# Patient Record
Sex: Male | Born: 1990 | Race: White | Hispanic: No | Smoking: Never smoker
Health system: Southern US, Community
[De-identification: ages and names within clinical notes are randomized; demographics above are authoritative.]

## PROBLEM LIST (undated history)

## (undated) DIAGNOSIS — R7989 Other specified abnormal findings of blood chemistry: Secondary | ICD-10-CM

## (undated) DIAGNOSIS — F988 Other specified behavioral and emotional disorders with onset usually occurring in childhood and adolescence: Secondary | ICD-10-CM

## (undated) DIAGNOSIS — N529 Male erectile dysfunction, unspecified: Secondary | ICD-10-CM

## (undated) HISTORY — DX: Male erectile dysfunction, unspecified: N52.9

## (undated) HISTORY — DX: Other specified abnormal findings of blood chemistry: R79.89

## (undated) HISTORY — DX: Other specified behavioral and emotional disorders with onset usually occurring in childhood and adolescence: F98.8

---

## 2016-02-06 DIAGNOSIS — F988 Other specified behavioral and emotional disorders with onset usually occurring in childhood and adolescence: Secondary | ICD-10-CM | POA: Insufficient documentation

## 2019-02-28 ENCOUNTER — Other Ambulatory Visit: Payer: Self-pay | Admitting: Internal Medicine

## 2019-03-21 ENCOUNTER — Ambulatory Visit: Payer: Self-pay | Admitting: Internal Medicine

## 2019-03-28 ENCOUNTER — Ambulatory Visit: Payer: Self-pay | Admitting: Internal Medicine

## 2019-03-28 ENCOUNTER — Other Ambulatory Visit: Payer: Self-pay

## 2019-03-28 ENCOUNTER — Encounter: Payer: Self-pay | Admitting: Internal Medicine

## 2019-03-28 VITALS — BP 148/92 | HR 89 | Temp 98.3°F | Resp 16 | Ht 70.0 in | Wt 252.0 lb

## 2019-03-28 DIAGNOSIS — F909 Attention-deficit hyperactivity disorder, unspecified type: Secondary | ICD-10-CM

## 2019-03-28 DIAGNOSIS — I10 Essential (primary) hypertension: Secondary | ICD-10-CM

## 2019-03-28 DIAGNOSIS — Z6836 Body mass index (BMI) 36.0-36.9, adult: Secondary | ICD-10-CM

## 2019-03-28 MED ORDER — LISINOPRIL 10 MG PO TABS: 10.0000 mg | ORAL_TABLET | Freq: Every day | ORAL | 3 refills | Status: DC

## 2019-03-28 MED ORDER — AMPHETAMINE-DEXTROAMPHETAMINE 30 MG PO TABS: 1.0000 | ORAL_TABLET | Freq: Every day | ORAL | 0 refills | Status: DC

## 2019-03-28 NOTE — Progress Notes (Signed)
S - Patient with no h/o HTN who follows-up for BP check-up. Was higher on his last visit (141/92) noted.  He does not exercise and has gained more weight (approx 5 lbs since last visit a month ago) His wife is pregnant and eating more and he thinks he follows.  Also requests refill of adderall, not taken in the last 4 days as wanted to see how his BP would be without and notes he can notice days when not takes, cannot focus as well. Stated tries to have days away, but I noted to him upon PMP review that he has been consistent with filling the meds on a monthly basis and not having many days away.   Has been feeling well with no complaints No CP, SOB, HA, palpitations, LE swelling.    No tob hx  Not really watch diet well  No Known Allergies    Current Outpatient Medications on File Prior to Visit  Medication Sig Dispense Refill  . amphetamine-dextroamphetamine (ADDERALL) 30 MG tablet TAKE 1 TABLET BY MOUTH DAILY AS DIRECTED 30 tablet 0   No current facility-administered medications on file prior to visit.    FH - no HTN noted    O - NAD, masked, obese   BP (!) 148/92 (BP Location: Left Arm, Patient Position: Sitting, Cuff Size: Large)   Pulse 89   Temp 98.3 F (36.8 C) (Oral)   Resp 16   Ht 5\' 10"  (1.778 m)   Wt 252 lb (114.3 kg)   SpO2 99%   BMI 36.16 kg/m   Recheck BP - 148/95 left with machine, large cuff Last weight 247.2 - 01/2019    HEENT - sclera anicteric Car - RRR without m/g/r Pulm - CTA Abd - obese, NT Ext - no LE edema Neuro - Affect not flat, approp with conversation   Last labs reviewed - kidney function normal, LDL 100 on check 01/2018    A/P - HTN - concern not as well controlled   Discussed addition of a BP medication to manage, added lisinopril - 10mg  daily Discussed goals for good control of BP  Importance of healthy diet and regular aerobic exercise and weight control noted If can get some home BP checks periodically and keep a log discussed,  would be helpful and he noted his mom has one he may be able to use.   2.  ADHD - tolerating stimulant medicine to help   Plan - Has signed [place] PMP reviewed Renewed medicine and aware cannot put refills on prescription Noted concerns with this med and higher BP, and the less use of the medicine would be helpful with respect to his BP and heart risks. Days away encouraged and periodic days without medicine can be beneficial  3.  Increased BMI/obesity  Importance of diet modifications and regular aerobic exercise emphasized with lifestyle changes adopted for the long term very important for success and often BP can correlate with weight increases noted.  F/u in 4 weeks, sooner prn

## 2019-03-28 NOTE — Patient Instructions (Signed)

## 2019-04-19 ENCOUNTER — Other Ambulatory Visit: Payer: Self-pay

## 2019-04-19 ENCOUNTER — Encounter: Payer: Self-pay | Admitting: Internal Medicine

## 2019-04-19 ENCOUNTER — Ambulatory Visit: Payer: 59 | Admitting: Internal Medicine

## 2019-04-19 VITALS — BP 127/90 | HR 88 | Temp 98.3°F | Resp 14 | Ht 70.0 in | Wt 248.0 lb

## 2019-04-19 DIAGNOSIS — I10 Essential (primary) hypertension: Secondary | ICD-10-CM

## 2019-04-19 DIAGNOSIS — E66812 Obesity, class 2: Secondary | ICD-10-CM | POA: Insufficient documentation

## 2019-04-19 DIAGNOSIS — F909 Attention-deficit hyperactivity disorder, unspecified type: Secondary | ICD-10-CM

## 2019-04-19 DIAGNOSIS — Z6835 Body mass index (BMI) 35.0-35.9, adult: Secondary | ICD-10-CM | POA: Insufficient documentation

## 2019-04-19 NOTE — Progress Notes (Signed)
S - Patient with no h/o HTN who follows-up for BPcheck-up after started on lisinopril last visit. Missed his dose yesterday am and was the first dose he missed. He has tried to exercise more, and change his diet, eating more salmon.   Trying to lessen the adderall as noted last visit as well, (noted he can notice days when not takes, cannot focus as well).    Has been feeling well with no complaints No CP, SOB, HA, palpitations, LE swelling.   He had a life insurance exam and his BP was very good then (done at work and was 120/60's). He took his BP once at home and was lower number in the 70's.  No tob hx  No Known Allergies Current Outpatient Medications on File Prior to Visit  Medication Sig Dispense Refill  . amphetamine-dextroamphetamine (ADDERALL) 30 MG tablet Take 1 tablet by mouth daily. as directed 30 tablet 0  . lisinopril (ZESTRIL) 10 MG tablet Take 1 tablet (10 mg total) by mouth daily. 90 tablet 3   No current facility-administered medications on file prior to visit.     FH - no HTN noted   O - NAD, masked, obese  BP 127/90 (BP Location: Right Arm, Patient Position: Sitting, Cuff Size: Large)   Pulse 88   Temp 98.3 F (36.8 C) (Oral)   Resp 14   Ht 5\' 10"  (1.778 m)   Wt 248 lb (112.5 kg)   SpO2 96%   BMI 35.58 kg/m    Recheck by machine by me was slightly higher BP -  140/99 (and he noted he was much more nervous on my recheck)  BP last visit -  148/92 weight last visit - 252 (down 4 lbs this visit)   HEENT - sclera anicteric Car - RRR without m/g/r Pulm - CTA Abd - obese, NT Ext - no LE edema Neuro - Affect not flat, approp with conversation  Last labs reviewed - kidney function normal, LDL 100 on check 01/2018   A/P - HTN - tolerating the low dose lisinopril. Outside readings have been very good, today still higher than goal noted in office  Cont lisinopril - 10mg  daily Discussed goals for good control of BP  Importance of healthy diet  and regular aerobic exercise and weight control noted and he has been doing better and encouraged to continue Rec'ed getting some home BP checks at least once weekly and keep a log discussed and bring them with him next visit   2.  ADHD - tolerating stimulant medicine to help   Trying to lessen this medicine as best can, lowest possible dose to get desired effects  3.  Increased BMI/obesity  Cont with lifestyle changes to help with weight loss as noted prior often BP can correlate with weight increases noted.  F/u in about 8 weeks with labs done for his annual physical then (fasting) and f/u a few days after the labs are obtained. F/u sooner prn

## 2019-04-23 ENCOUNTER — Ambulatory Visit: Payer: Self-pay | Admitting: Internal Medicine

## 2019-04-25 ENCOUNTER — Other Ambulatory Visit: Payer: Self-pay | Admitting: Internal Medicine

## 2019-04-25 MED ORDER — AMPHETAMINE-DEXTROAMPHETAMINE 30 MG PO TABS: 30.0000 mg | ORAL_TABLET | Freq: Every day | ORAL | 0 refills | Status: DC

## 2019-04-25 NOTE — Telephone Encounter (Signed)
Pt aware and needs to keep next appt for refills.

## 2019-04-25 NOTE — Telephone Encounter (Signed)
He was seen last week for his BP (in Epic).

## 2019-04-25 NOTE — Telephone Encounter (Signed)
Patient last seen May, Had that ADHD med refill and two more since. Needs f/u again before another refill (with his BP high on May visit noted).

## 2019-04-25 NOTE — Telephone Encounter (Signed)
Refilled med

## 2019-04-25 NOTE — Progress Notes (Signed)
Refilled ADHD med as requested.

## 2019-05-21 ENCOUNTER — Other Ambulatory Visit: Payer: Self-pay | Admitting: Internal Medicine

## 2019-06-20 ENCOUNTER — Other Ambulatory Visit: Payer: Self-pay

## 2019-06-20 ENCOUNTER — Ambulatory Visit: Payer: 59

## 2019-06-20 DIAGNOSIS — Z0000 Encounter for general adult medical examination without abnormal findings: Secondary | ICD-10-CM

## 2019-06-20 LAB — POCT URINALYSIS DIPSTICK
Bilirubin, UA: NEGATIVE
Blood, UA: NEGATIVE
Glucose, UA: NEGATIVE
Ketones, UA: NEGATIVE
Leukocytes, UA: NEGATIVE
Nitrite, UA: NEGATIVE
Protein, UA: NEGATIVE
Spec Grav, UA: >=1.03 — AB (ref 1.010–1.025)
Urobilinogen, UA: 0.2 E.U./dL
pH, UA: 5.5 (ref 5.0–8.0)

## 2019-06-20 NOTE — Progress Notes (Signed)
Patient presents with no Covid symptoms.  His daughter had a covid test within the past 14 days & it was negative.

## 2019-06-21 LAB — CMP12+LP+TP+TSH+6AC+CBC/D/PLT
ALT: 54 IU/L — ABNORMAL HIGH (ref 0–44)
AST: 32 IU/L (ref 0–40)
Albumin/Globulin Ratio: 2 (ref 1.2–2.2)
Albumin: 4.7 g/dL (ref 4.1–5.2)
Alkaline Phosphatase: 61 IU/L (ref 39–117)
BUN/Creatinine Ratio: 13 (ref 9–20)
BUN: 14 mg/dL (ref 6–20)
Basophils Absolute: 0 10*3/uL (ref 0.0–0.2)
Basos: 1 %
Bilirubin Total: 0.4 mg/dL (ref 0.0–1.2)
Calcium: 9.9 mg/dL (ref 8.7–10.2)
Chloride: 99 mmol/L (ref 96–106)
Chol/HDL Ratio: 3.4 ratio (ref 0.0–5.0)
Cholesterol, Total: 161 mg/dL (ref 100–199)
Creatinine, Ser: 1.08 mg/dL (ref 0.76–1.27)
EOS (ABSOLUTE): 0 10*3/uL (ref 0.0–0.4)
Eos: 1 %
Estimated CHD Risk: 0.5 times avg. (ref 0.0–1.0)
Free Thyroxine Index: 2.3 (ref 1.2–4.9)
GFR calc Af Amer: 106 mL/min/{1.73_m2} (ref >59)
GFR calc non Af Amer: 92 mL/min/{1.73_m2} (ref >59)
GGT: 37 IU/L (ref 0–65)
Globulin, Total: 2.3 g/dL (ref 1.5–4.5)
Glucose: 88 mg/dL (ref 65–99)
HDL: 47 mg/dL (ref >39)
Hematocrit: 47.5 % (ref 37.5–51.0)
Hemoglobin: 16.3 g/dL (ref 13.0–17.7)
Immature Grans (Abs): 0 10*3/uL (ref 0.0–0.1)
Immature Granulocytes: 0 %
Iron: 136 ug/dL (ref 38–169)
LDH: 204 IU/L (ref 121–224)
LDL Chol Calc (NIH): 99 mg/dL (ref 0–99)
Lymphocytes Absolute: 1.9 10*3/uL (ref 0.7–3.1)
Lymphs: 35 %
MCH: 30.5 pg (ref 26.6–33.0)
MCHC: 34.3 g/dL (ref 31.5–35.7)
MCV: 89 fL (ref 79–97)
Monocytes Absolute: 0.5 10*3/uL (ref 0.1–0.9)
Monocytes: 10 %
Neutrophils Absolute: 2.8 10*3/uL (ref 1.4–7.0)
Neutrophils: 53 %
Phosphorus: 3.9 mg/dL (ref 2.8–4.1)
Platelets: 252 10*3/uL (ref 150–450)
Potassium: 5 mmol/L (ref 3.5–5.2)
RBC: 5.34 x10E6/uL (ref 4.14–5.80)
RDW: 12.7 % (ref 11.6–15.4)
Sodium: 137 mmol/L (ref 134–144)
T3 Uptake Ratio: 30 % (ref 24–39)
T4, Total: 7.5 ug/dL (ref 4.5–12.0)
TSH: 1.93 u[IU]/mL (ref 0.450–4.500)
Total Protein: 7 g/dL (ref 6.0–8.5)
Triglycerides: 79 mg/dL (ref 0–149)
Uric Acid: 6.1 mg/dL (ref 3.7–8.6)
VLDL Cholesterol Cal: 15 mg/dL (ref 5–40)
WBC: 5.3 10*3/uL (ref 3.4–10.8)

## 2019-06-26 ENCOUNTER — Other Ambulatory Visit: Payer: Self-pay

## 2019-06-26 ENCOUNTER — Encounter: Payer: Self-pay | Admitting: Physician Assistant

## 2019-06-26 ENCOUNTER — Ambulatory Visit: Payer: 59 | Admitting: Physician Assistant

## 2019-06-26 VITALS — BP 140/84 | HR 90 | Temp 97.8°F | Resp 16 | Ht 70.0 in | Wt 251.0 lb

## 2019-06-26 DIAGNOSIS — Z0000 Encounter for general adult medical examination without abnormal findings: Secondary | ICD-10-CM

## 2019-06-26 MED ORDER — LISINOPRIL 10 MG PO TABS: 10.0000 mg | ORAL_TABLET | Freq: Every day | ORAL | 3 refills | Status: DC

## 2019-06-26 MED ORDER — AMPHETAMINE-DEXTROAMPHETAMINE 30 MG PO TABS: 1.0000 | ORAL_TABLET | Freq: Every day | ORAL | 0 refills | Status: DC

## 2019-06-26 NOTE — Progress Notes (Signed)
Labs for physical on 06/20/2019.  AMD

## 2019-06-26 NOTE — Progress Notes (Signed)
   Subjective:Physcal Exam    Patient ID: Ernest Alvarez, male    DOB: 1991/04/21, 28 y.o.   MRN: [id number]  HPI Patient present for physical exam. Voice no compliant. Request refill of medication. Completed routine labs on 06/20/2019 with no acute findings.   Review of Systems ADD Hypertension     Objective:   Physical Exam HEENT unremarkable Neck supple w/o adenopathy or bruits Lung CTA Heart RRR  Abdomen, normoactive BS, SNTTP. No cervical of lumbar deformity.  Full and equal ROM. Extremities  with no deformity,full and equal ROM CN II-XII grossly intact.       Assessment & Plan:  Well exam. Refill Adderall and Lisinopril. F/u PRN

## 2019-06-26 NOTE — Progress Notes (Signed)
   Subjective:    Patient ID: Ernest Alvarez, male    DOB: 1990-12-18, 28 y.o.   MRN: [id number]  HPI    Review of Systems     Objective:   Physical Exam        Assessment & Plan:

## 2019-07-25 ENCOUNTER — Other Ambulatory Visit: Payer: Self-pay

## 2019-07-25 DIAGNOSIS — F909 Attention-deficit hyperactivity disorder, unspecified type: Secondary | ICD-10-CM

## 2019-07-25 MED ORDER — AMPHETAMINE-DEXTROAMPHETAMINE 30 MG PO TABS: 30.0000 mg | ORAL_TABLET | Freq: Every day | ORAL | 0 refills | Status: DC

## 2019-07-25 NOTE — Telephone Encounter (Signed)
Patient saw PA Tamala Julian 06/26/2019 labs stable ALT slightly elevated otherwise normal male exec panel.  CSA on file with Dr Roxan Hockey signed 11/08/2018  [place] PMP website reviewed All Rx in past 1.5 years filled by [facility] providers.  Last Rx 06/26/2019 PA Smith.  Electronic Rx sent to his pharmacy of choice and face to face visit will be required Jan 2021.

## 2019-08-23 ENCOUNTER — Other Ambulatory Visit: Payer: Self-pay

## 2019-08-23 ENCOUNTER — Other Ambulatory Visit: Payer: Self-pay | Admitting: Registered Nurse

## 2019-08-23 DIAGNOSIS — F909 Attention-deficit hyperactivity disorder, unspecified type: Secondary | ICD-10-CM

## 2019-08-23 NOTE — Telephone Encounter (Signed)
[place] patient filling in until new PCM hired.  Last fill 07/25/2019 per PMP Aware review. Since Mar 2019 all Rx filled by [facility] provider and prior to that [facility].   Last office visit with PA Smith 26 Jun 2019  BP stable    Labs 06/20/2019.  ALT slightly elevated AST GGT normal and CBC/renal function WNL.  Next face to face due 26 Sep 2019.  Electronic Rx Adderall 82m po daily #30 RF0.   30 day supply to his pharmacy of choice.

## 2019-09-13 ENCOUNTER — Other Ambulatory Visit: Payer: Self-pay | Admitting: Registered Nurse

## 2019-09-13 ENCOUNTER — Encounter: Payer: Self-pay | Admitting: Registered Nurse

## 2019-09-13 DIAGNOSIS — F909 Attention-deficit hyperactivity disorder, unspecified type: Secondary | ICD-10-CM

## 2019-09-13 NOTE — Telephone Encounter (Signed)
Last fill 08/23/2019 per [place] PMP website review.  All prescriptions from [facility] [place] providers in the previous year.  Last office visit 06/26/2019 with PA Katrinka Blazing needs face to Feb 2021 with [facility] provider.

## 2019-09-13 NOTE — Telephone Encounter (Signed)
Patient may fill new Rx after 19 Sep 2019 electronically sent adderal 30mg  po daily #30 RF0 to his pharmacy of choice.

## 2019-10-18 ENCOUNTER — Other Ambulatory Visit: Payer: Self-pay

## 2019-10-18 ENCOUNTER — Encounter: Payer: Self-pay | Admitting: Physician Assistant

## 2019-10-18 ENCOUNTER — Other Ambulatory Visit: Payer: Self-pay | Admitting: Registered Nurse

## 2019-10-18 ENCOUNTER — Ambulatory Visit: Payer: 59 | Admitting: Physician Assistant

## 2019-10-18 DIAGNOSIS — F909 Attention-deficit hyperactivity disorder, unspecified type: Secondary | ICD-10-CM

## 2019-10-18 MED ORDER — AMPHETAMINE-DEXTROAMPHETAMINE 30 MG PO TABS: 30.0000 mg | ORAL_TABLET | Freq: Every day | ORAL | 0 refills | Status: DC

## 2019-10-18 NOTE — Telephone Encounter (Signed)
Noted request sent to peer working in clinic today and tomorrow to see if patient can be worked into her schedule/sooner office visit.

## 2019-10-18 NOTE — Telephone Encounter (Signed)
Appointment scheduled for 10/31/2019.  AMD

## 2019-10-18 NOTE — Telephone Encounter (Signed)
Please schedule OV with provider.

## 2019-10-18 NOTE — Progress Notes (Signed)
Presents for quarterly face to face appointment for Rx refill.  AMD

## 2019-10-18 NOTE — Telephone Encounter (Signed)
Patient is scheduled for first available

## 2019-10-18 NOTE — Telephone Encounter (Signed)
Last office visit 06/26/2019 with PA Katrinka Blazing needs office visit prior to next refill as greater than 3 months since last face to face.

## 2019-10-18 NOTE — Progress Notes (Signed)
   Subjective:    Patient ID: Ernest Alvarez, male    DOB: 18-Apr-1991, 29 y.o.   MRN: [id number]  HPI  29 yo Sy has taken Adderall for the last few years with excellent tolerance. He has chosen to split his 30 mg tablet and take 1/2 tab BID as he did when first starting Rx, and has continued same.  Covid 19 restrictions and bored over-eating have been responsible for weight gain in his opinion and he is ready now to "get a handle on it" Hx of being overweight continunuum.  He and his wife also have a new 46 week old baby boy to go with their 70 ? month daughter- things are settling down in to pattern and enjoyment is increasing. He know he has overeaten with family members keeping them fed to rest his wife.  Denies concerns or symptoms related to medication Takes Lisinopril 10 mg 1 QD   Review of Systems As noted    Objective:   Physical Exam 261 pounds   ( 240 in 2018)   134/72 Alert interactive, pleasant young man very pleased with his growing family and life direction. Looking forward to raising the children and Culturing a good relationship with wife and children.       Assessment & Plan:  Encouraged to focus on healthy food choices and smaller servings. Brisk daily walk  3o minutes or more with goal of healthier lifetime  Wishes to continue Rx - may try a whole tablet once daily if desired to see if as well tolerated just for convenience PRN  Reminded we need to see him every 90 days but he can call or have pharmacy contact us for the 2 interim  30 day Rx if desired.  Lisinopril 10 mg 1 qd for BP  [facility], [employer]

## 2019-10-31 ENCOUNTER — Ambulatory Visit: Payer: Self-pay

## 2019-11-12 ENCOUNTER — Other Ambulatory Visit: Payer: Self-pay

## 2019-11-12 DIAGNOSIS — F909 Attention-deficit hyperactivity disorder, unspecified type: Secondary | ICD-10-CM

## 2019-11-12 MED ORDER — AMPHETAMINE-DEXTROAMPHETAMINE 30 MG PO TABS: 30.0000 mg | ORAL_TABLET | Freq: Every day | ORAL | 0 refills | Status: DC

## 2019-11-12 NOTE — Telephone Encounter (Signed)
[place] PMP site states no Rx in previous two years today.  Patient last rx PA Nedra Hai 10/18/2019 adderall 30mg  po daily #30 RF0 per Epic.  CSA in paper chart signed with Dr 11/08/2018  Last office visit 10/18/2019 BP 134/72 HR 80  Electronic Rx to his pharmacy of choice adderall 30mg  po daily #30 RF0  Next face to face due 01/15/2020

## 2019-12-17 ENCOUNTER — Other Ambulatory Visit: Payer: Self-pay | Admitting: Registered Nurse

## 2019-12-17 DIAGNOSIS — F909 Attention-deficit hyperactivity disorder, unspecified type: Secondary | ICD-10-CM

## 2019-12-17 NOTE — Telephone Encounter (Signed)
[facility] pt. Thanks!

## 2019-12-18 ENCOUNTER — Other Ambulatory Visit: Payer: Self-pay

## 2019-12-18 DIAGNOSIS — F909 Attention-deficit hyperactivity disorder, unspecified type: Secondary | ICD-10-CM

## 2019-12-19 MED ORDER — AMPHETAMINE-DEXTROAMPHETAMINE 30 MG PO TABS: 30.0000 mg | ORAL_TABLET | Freq: Every day | ORAL | 0 refills | Status: DC

## 2019-12-19 NOTE — Telephone Encounter (Signed)
Adderall 30 mg po daily #30 RF0 last filled 11/12/2019.  Last office visit PA Nedra Hai 10/18/2019 for hypertension lisinopril 10mg  po daily BP 134/72  Labs 06/20/2019 renal function normal elevated ALT otherwise normal LFTs  Scheduled for office visit 02/10/2020.  Signed controlled substances agreement in file dated 11/08/2018 by patient.  Electronic Rx sent to patient pharmacy of choice adderall 30mg  po daily #30 RF0  PMP website reviewed and last Rx 11/12/2019 from [facility] provider.

## 2020-01-10 ENCOUNTER — Ambulatory Visit: Payer: Self-pay | Admitting: Physician Assistant

## 2020-01-21 ENCOUNTER — Other Ambulatory Visit: Payer: Self-pay

## 2020-01-21 ENCOUNTER — Ambulatory Visit: Payer: Self-pay | Admitting: Physician Assistant

## 2020-01-21 ENCOUNTER — Encounter: Payer: Self-pay | Admitting: Physician Assistant

## 2020-01-21 ENCOUNTER — Other Ambulatory Visit: Payer: Self-pay | Admitting: Registered Nurse

## 2020-01-21 VITALS — BP 133/86 | HR 94 | Temp 98.5°F | Resp 12 | Ht 70.0 in | Wt 250.0 lb

## 2020-01-21 DIAGNOSIS — Z760 Encounter for issue of repeat prescription: Secondary | ICD-10-CM

## 2020-01-21 DIAGNOSIS — F909 Attention-deficit hyperactivity disorder, unspecified type: Secondary | ICD-10-CM

## 2020-01-21 MED ORDER — AMPHETAMINE-DEXTROAMPHETAMINE 30 MG PO TABS: 30.0000 mg | ORAL_TABLET | Freq: Every day | ORAL | 0 refills | Status: DC

## 2020-01-21 NOTE — Progress Notes (Signed)
   Subjective: Medication refill for ADD    Patient ID: Ernest Alvarez, male    DOB: Sep 25, 1990, 29 y.o.   MRN: [id number]  HPI Patient presents for medication refill for ADD.  This is the patient 3 months mandatory in person visit for medication management.   Review of Systems ADD, hypertension, and obesity.     Objective:   Physical Exam No acute distress.  HEENT is unremarkable.  Neck is supple for adenopathy or bruits.  Lungs clear to auscultation heart regular rate and rhythm.         Assessment & Plan: Medication refill for ADD Discussed medication management for all the Adderall.  Patient follow-up in 3 months.

## 2020-02-15 ENCOUNTER — Other Ambulatory Visit: Payer: Self-pay

## 2020-02-15 DIAGNOSIS — N529 Male erectile dysfunction, unspecified: Secondary | ICD-10-CM

## 2020-02-15 MED ORDER — SILDENAFIL CITRATE 20 MG PO TABS: 20.0000 mg | ORAL_TABLET | ORAL | 2 refills | Status: AC

## 2020-02-18 ENCOUNTER — Other Ambulatory Visit: Payer: Self-pay

## 2020-02-18 DIAGNOSIS — F909 Attention-deficit hyperactivity disorder, unspecified type: Secondary | ICD-10-CM

## 2020-02-18 MED ORDER — AMPHETAMINE-DEXTROAMPHETAMINE 30 MG PO TABS: 30.0000 mg | ORAL_TABLET | Freq: Every day | ORAL | 0 refills | Status: DC

## 2020-03-13 ENCOUNTER — Other Ambulatory Visit: Payer: Self-pay | Admitting: Physician Assistant

## 2020-03-13 DIAGNOSIS — F909 Attention-deficit hyperactivity disorder, unspecified type: Secondary | ICD-10-CM

## 2020-03-17 ENCOUNTER — Other Ambulatory Visit: Payer: Self-pay | Admitting: Emergency Medicine

## 2020-03-17 DIAGNOSIS — F909 Attention-deficit hyperactivity disorder, unspecified type: Secondary | ICD-10-CM

## 2020-03-17 MED ORDER — AMPHETAMINE-DEXTROAMPHETAMINE 30 MG PO TABS: 30.0000 mg | ORAL_TABLET | Freq: Every day | ORAL | 0 refills | Status: DC

## 2020-04-15 ENCOUNTER — Other Ambulatory Visit: Payer: Self-pay

## 2020-04-15 ENCOUNTER — Ambulatory Visit: Payer: Self-pay | Admitting: Emergency Medicine

## 2020-04-15 ENCOUNTER — Encounter: Payer: Self-pay | Admitting: Emergency Medicine

## 2020-04-15 VITALS — BP 142/88 | HR 84 | Temp 98.7°F | Resp 16 | Ht 70.0 in | Wt 250.0 lb

## 2020-04-15 DIAGNOSIS — F909 Attention-deficit hyperactivity disorder, unspecified type: Secondary | ICD-10-CM

## 2020-04-15 DIAGNOSIS — Z139 Encounter for screening, unspecified: Secondary | ICD-10-CM

## 2020-04-15 MED ORDER — AMPHETAMINE-DEXTROAMPHETAMINE 30 MG PO TABS: 30.0000 mg | ORAL_TABLET | Freq: Every day | ORAL | 0 refills | Status: DC

## 2020-04-15 NOTE — Progress Notes (Signed)
  Occupational Health Provider Note       Time seen: 3:27 PM    I have reviewed the vital signs and the nursing notes.  HISTORY   Chief Complaint Medication Refill (adderall)   HPI Ernest Alvarez is a 29 y.o. male with a history of ADD, erectile dysfunction, elevated LFTs who presents today for Adderall refill.  Patient denies any complaints, has not had a recent sickness.  Past Medical History:  Diagnosis Date  . ADD (attention deficit disorder)   . ED (erectile dysfunction)   . Elevated LFTs     History reviewed. No pertinent surgical history.  Allergies Patient has no known allergies.   Review of Systems Constitutional: Negative for fever. Cardiovascular: Negative for chest pain. Respiratory: Negative for shortness of breath. Gastrointestinal: Negative for abdominal pain, vomiting and diarrhea. Musculoskeletal: Negative for back pain. Skin: Negative for rash. Neurological: Negative for headaches, focal weakness or numbness.  All systems negative/normal/unremarkable except as stated in the HPI  ____________________________________________   PHYSICAL EXAM:  VITAL SIGNS: Vitals:   04/15/20 1515  BP: (!) 142/88  Pulse: 84  Resp: 16  Temp: 98.7 F (37.1 C)  SpO2: 98%    Constitutional: Alert and oriented. Well appearing and in no distress. Eyes: Conjunctivae are normal. Normal extraocular movements. Cardiovascular: Normal rate, regular rhythm. No murmurs, rubs, or gallops. Respiratory: Normal respiratory effort without tachypnea nor retractions. Breath sounds are clear and equal bilaterally. No wheezes/rales/rhonchi. Gastrointestinal: Soft and nontender. Normal bowel sounds Musculoskeletal: Nontender with normal range of motion in extremities. No lower extremity tenderness nor edema. Neurologic:  Normal speech and language. No gross focal neurologic deficits are appreciated.  Skin:  Skin is warm, dry and intact. No rash noted. Psychiatric: Speech and  behavior are normal.   ____________________________________________   LABS (pertinent positives/negatives)  No results found for this or any previous visit (from the past 2160 hour(s)).   ASSESSMENT AND PLAN  Adderall refill   Plan: The patient had presented for an Adderall refill.  Patient looks well, is no acute distress.  He is advised to periodically check his blood pressure at home.  He is cleared for refills at this time.  Daryel November MD    Note: This note was generated in part or whole with voice recognition software. Voice recognition is usually quite accurate but there are transcription errors that can and very often do occur. I apologize for any typographical errors that were not detected and corrected.

## 2020-05-13 ENCOUNTER — Other Ambulatory Visit: Payer: Self-pay | Admitting: Emergency Medicine

## 2020-05-13 DIAGNOSIS — F909 Attention-deficit hyperactivity disorder, unspecified type: Secondary | ICD-10-CM

## 2020-06-10 ENCOUNTER — Other Ambulatory Visit: Payer: Self-pay | Admitting: Physician Assistant

## 2020-06-10 DIAGNOSIS — F909 Attention-deficit hyperactivity disorder, unspecified type: Secondary | ICD-10-CM

## 2020-06-12 ENCOUNTER — Other Ambulatory Visit: Payer: Self-pay

## 2020-06-12 DIAGNOSIS — F909 Attention-deficit hyperactivity disorder, unspecified type: Secondary | ICD-10-CM

## 2020-06-12 MED ORDER — AMPHETAMINE-DEXTROAMPHETAMINE 30 MG PO TABS: 1.0000 | ORAL_TABLET | Freq: Every day | ORAL | 0 refills | Status: DC

## 2020-07-07 ENCOUNTER — Other Ambulatory Visit: Payer: Self-pay

## 2020-07-07 DIAGNOSIS — F909 Attention-deficit hyperactivity disorder, unspecified type: Secondary | ICD-10-CM

## 2020-07-07 MED ORDER — AMPHETAMINE-DEXTROAMPHETAMINE 30 MG PO TABS: 1.0000 | ORAL_TABLET | Freq: Every day | ORAL | 0 refills | Status: DC

## 2020-08-06 ENCOUNTER — Other Ambulatory Visit: Payer: Self-pay

## 2020-08-06 DIAGNOSIS — F909 Attention-deficit hyperactivity disorder, unspecified type: Secondary | ICD-10-CM

## 2020-08-06 MED ORDER — AMPHETAMINE-DEXTROAMPHETAMINE 30 MG PO TABS: 1.0000 | ORAL_TABLET | Freq: Every day | ORAL | 0 refills | Status: DC

## 2020-08-07 ENCOUNTER — Other Ambulatory Visit: Payer: Self-pay | Admitting: Physician Assistant

## 2020-08-07 DIAGNOSIS — I10 Essential (primary) hypertension: Secondary | ICD-10-CM

## 2020-08-11 ENCOUNTER — Telehealth: Payer: Self-pay

## 2020-08-11 NOTE — Telephone Encounter (Signed)
Rx refill request already in Epic - re-routed to Ron Smith, PA-C.  AMD 

## 2020-08-12 NOTE — Telephone Encounter (Signed)
Error

## 2020-08-13 NOTE — Progress Notes (Signed)
Pt scheduled to complete physical 08/22/20 Durward Parcel, PA-C  CL,RMA

## 2020-08-14 ENCOUNTER — Other Ambulatory Visit: Payer: Self-pay

## 2020-08-14 ENCOUNTER — Ambulatory Visit: Payer: Self-pay

## 2020-08-14 DIAGNOSIS — Z01818 Encounter for other preprocedural examination: Secondary | ICD-10-CM

## 2020-08-14 LAB — POCT URINALYSIS DIPSTICK
Bilirubin, UA: NEGATIVE
Blood, UA: NEGATIVE
Glucose, UA: NEGATIVE
Ketones, UA: NEGATIVE
Leukocytes, UA: NEGATIVE
Nitrite, UA: NEGATIVE
Protein, UA: NEGATIVE
Spec Grav, UA: 1.025 (ref 1.010–1.025)
Urobilinogen, UA: 0.2 E.U./dL
pH, UA: 5.5 (ref 5.0–8.0)

## 2020-08-15 LAB — CMP12+LP+TP+TSH+6AC+CBC/D/PLT
ALT: 45 IU/L — ABNORMAL HIGH (ref 0–44)
AST: 29 IU/L (ref 0–40)
Albumin/Globulin Ratio: 1.9 (ref 1.2–2.2)
Albumin: 4.4 g/dL (ref 4.0–5.0)
Alkaline Phosphatase: 62 IU/L (ref 44–121)
BUN/Creatinine Ratio: 13 (ref 9–20)
BUN: 14 mg/dL (ref 6–20)
Basophils Absolute: 0 10*3/uL (ref 0.0–0.2)
Basos: 1 %
Bilirubin Total: 0.4 mg/dL (ref 0.0–1.2)
Calcium: 9.5 mg/dL (ref 8.7–10.2)
Chloride: 107 mmol/L — ABNORMAL HIGH (ref 96–106)
Chol/HDL Ratio: 3.7 ratio (ref 0.0–5.0)
Cholesterol, Total: 158 mg/dL (ref 100–199)
Creatinine, Ser: 1.07 mg/dL (ref 0.76–1.27)
EOS (ABSOLUTE): 0.1 10*3/uL (ref 0.0–0.4)
Eos: 2 %
Estimated CHD Risk: 0.6 times avg. (ref 0.0–1.0)
Free Thyroxine Index: 1.8 (ref 1.2–4.9)
GFR calc Af Amer: 106 mL/min/{1.73_m2} (ref >59)
GFR calc non Af Amer: 92 mL/min/{1.73_m2} (ref >59)
GGT: 30 IU/L (ref 0–65)
Globulin, Total: 2.3 g/dL (ref 1.5–4.5)
Glucose: 96 mg/dL (ref 65–99)
HDL: 43 mg/dL (ref >39)
Hematocrit: 45.8 % (ref 37.5–51.0)
Hemoglobin: 15.8 g/dL (ref 13.0–17.7)
Immature Grans (Abs): 0 10*3/uL (ref 0.0–0.1)
Immature Granulocytes: 0 %
Iron: 83 ug/dL (ref 38–169)
LDH: 204 IU/L (ref 121–224)
LDL Chol Calc (NIH): 92 mg/dL (ref 0–99)
Lymphocytes Absolute: 1.6 10*3/uL (ref 0.7–3.1)
Lymphs: 30 %
MCH: 30.5 pg (ref 26.6–33.0)
MCHC: 34.5 g/dL (ref 31.5–35.7)
MCV: 88 fL (ref 79–97)
Monocytes Absolute: 0.4 10*3/uL (ref 0.1–0.9)
Monocytes: 7 %
Neutrophils Absolute: 3.1 10*3/uL (ref 1.4–7.0)
Neutrophils: 60 %
Phosphorus: 3.6 mg/dL (ref 2.8–4.1)
Platelets: 242 10*3/uL (ref 150–450)
Potassium: 4.3 mmol/L (ref 3.5–5.2)
RBC: 5.18 x10E6/uL (ref 4.14–5.80)
RDW: 12.1 % (ref 11.6–15.4)
Sodium: 140 mmol/L (ref 134–144)
T3 Uptake Ratio: 28 % (ref 24–39)
T4, Total: 6.5 ug/dL (ref 4.5–12.0)
TSH: 1.51 u[IU]/mL (ref 0.450–4.500)
Total Protein: 6.7 g/dL (ref 6.0–8.5)
Triglycerides: 127 mg/dL (ref 0–149)
Uric Acid: 6.2 mg/dL (ref 3.8–8.4)
VLDL Cholesterol Cal: 23 mg/dL (ref 5–40)
WBC: 5.2 10*3/uL (ref 3.4–10.8)

## 2020-08-22 ENCOUNTER — Encounter: Payer: Self-pay | Admitting: Physician Assistant

## 2020-08-22 ENCOUNTER — Other Ambulatory Visit: Payer: Self-pay

## 2020-08-22 ENCOUNTER — Ambulatory Visit: Payer: Self-pay | Admitting: Physician Assistant

## 2020-08-22 VITALS — BP 140/80 | HR 109 | Temp 97.8°F | Resp 14 | Ht 70.0 in | Wt 248.0 lb

## 2020-08-22 DIAGNOSIS — Z0000 Encounter for general adult medical examination without abnormal findings: Secondary | ICD-10-CM

## 2020-08-22 NOTE — Progress Notes (Signed)
   Subjective: Annual physical exam    Patient ID: Ernest Alvarez, male    DOB: [birth year], 29 y.o.   MRN: [id number]  HPI Patient presents for annual physical exam.  No concerns or complaints.   Review of Systems    ADHD, erectile dysfunction, and hypertension. Objective:   Physical Exam No acute distress.  HEENT is unremarkable.  Neck is supple without adenopathy or bruits.  Lungs are clear to auscultation.  Heart regular rate and rhythm.  Abdomen with negative HSM, normoactive bowel sounds, soft, nontender palpation.  No obvious deformity to the upper or lower extremities.  Patient has full and equal range of motion of the upper and lower extremities.  No obvious deformity to the cervical lumbar spine.  Patient has full and equal range of motion cervical lumbar spine.  Cranial nerves II through XII are grossly intact.       Assessment & Plan: Well exam.  Discussed lab results with patient.  Advised continue previous medication follow-up as needed.

## 2020-09-03 ENCOUNTER — Other Ambulatory Visit: Payer: Self-pay

## 2020-09-03 DIAGNOSIS — F909 Attention-deficit hyperactivity disorder, unspecified type: Secondary | ICD-10-CM

## 2020-09-03 MED ORDER — AMPHETAMINE-DEXTROAMPHETAMINE 30 MG PO TABS: 1.0000 | ORAL_TABLET | Freq: Every day | ORAL | 0 refills | Status: DC

## 2020-10-02 ENCOUNTER — Other Ambulatory Visit: Payer: 59

## 2020-10-03 ENCOUNTER — Other Ambulatory Visit: Payer: Self-pay

## 2020-10-03 DIAGNOSIS — F909 Attention-deficit hyperactivity disorder, unspecified type: Secondary | ICD-10-CM

## 2020-10-08 ENCOUNTER — Other Ambulatory Visit: Payer: Self-pay

## 2020-10-08 DIAGNOSIS — F909 Attention-deficit hyperactivity disorder, unspecified type: Secondary | ICD-10-CM

## 2020-10-08 MED ORDER — AMPHETAMINE-DEXTROAMPHETAMINE 30 MG PO TABS: 1.0000 | ORAL_TABLET | Freq: Every day | ORAL | 0 refills | Status: DC

## 2020-10-21 ENCOUNTER — Ambulatory Visit: Payer: Self-pay | Admitting: Adult Medicine

## 2020-10-29 ENCOUNTER — Ambulatory Visit: Payer: Self-pay

## 2020-10-30 ENCOUNTER — Ambulatory Visit: Payer: Self-pay | Admitting: Physician Assistant

## 2020-10-30 ENCOUNTER — Other Ambulatory Visit: Payer: Self-pay

## 2020-10-30 ENCOUNTER — Encounter: Payer: Self-pay | Admitting: Physician Assistant

## 2020-10-30 DIAGNOSIS — F909 Attention-deficit hyperactivity disorder, unspecified type: Secondary | ICD-10-CM

## 2020-10-30 MED ORDER — AMPHETAMINE-DEXTROAMPHETAMINE 30 MG PO TABS: 1.0000 | ORAL_TABLET | Freq: Every day | ORAL | 0 refills | Status: DC

## 2020-10-30 NOTE — Progress Notes (Signed)
   Subjective: Medication refill    Patient ID: Ernest Alvarez, male    DOB: 10-Oct-1990, 30 y.o.   MRN: [id number]  HPI Patient presents from medication refill of Adderall for ADHD.  Patient voices no concerns or complaints.   Review of Systems    ADHD, erectile dysfunction, and hypertension. Objective:   Physical Exam  No acute distress.  BP is 139/84, pulse 99, respiration 14, and temperature is 98.8.      Assessment & Plan: Medication refill for ADHD  Prescription for Adderall 30 mg to take daily.

## 2020-12-03 ENCOUNTER — Other Ambulatory Visit: Payer: Self-pay | Admitting: Physician Assistant

## 2020-12-03 DIAGNOSIS — F909 Attention-deficit hyperactivity disorder, unspecified type: Secondary | ICD-10-CM

## 2020-12-05 ENCOUNTER — Telehealth: Payer: Self-pay

## 2020-12-05 NOTE — Telephone Encounter (Signed)
Refill request already in Epic - re-routed to Ron Smith, PA-C.  AMD 

## 2020-12-31 ENCOUNTER — Other Ambulatory Visit: Payer: Self-pay

## 2020-12-31 DIAGNOSIS — F909 Attention-deficit hyperactivity disorder, unspecified type: Secondary | ICD-10-CM

## 2021-01-01 ENCOUNTER — Other Ambulatory Visit: Payer: Self-pay

## 2021-01-01 DIAGNOSIS — F909 Attention-deficit hyperactivity disorder, unspecified type: Secondary | ICD-10-CM

## 2021-01-01 MED ORDER — AMPHETAMINE-DEXTROAMPHETAMINE 30 MG PO TABS: 1.0000 | ORAL_TABLET | Freq: Every day | ORAL | 0 refills | Status: DC

## 2021-01-09 ENCOUNTER — Ambulatory Visit: Payer: Self-pay | Admitting: Emergency Medicine

## 2021-01-09 ENCOUNTER — Telehealth: Payer: Self-pay

## 2021-01-09 ENCOUNTER — Encounter: Payer: Self-pay | Admitting: Emergency Medicine

## 2021-01-09 VITALS — BP 129/83 | HR 95 | Temp 99.0°F | Wt 255.0 lb

## 2021-01-09 DIAGNOSIS — J069 Acute upper respiratory infection, unspecified: Secondary | ICD-10-CM

## 2021-01-09 DIAGNOSIS — R509 Fever, unspecified: Secondary | ICD-10-CM

## 2021-01-09 LAB — POCT INFLUENZA A/B
Influenza A, POC: NEGATIVE
Influenza B, POC: NEGATIVE

## 2021-01-09 NOTE — Telephone Encounter (Signed)
Ernest Alvarez called to report that his family has been ill and he is now ill as well. Family members have been tested for COVID within 2 days and all test negative. No known COVID exposures.   Ernest Alvarez reports fever of 100.48F, congestion and coughing up mucous, color unidentified.  He is aware we will conduct a rapid COVID test prior to appt with provider today and will wear a mask.

## 2021-01-09 NOTE — Progress Notes (Signed)
  Subjective:     Patient ID: Ernest Alvarez, male   DOB: 03-28-1991, 30 y.o.   MRN: [id number]  HPI Fever and cough that began 2 days ago.  Wife has pneumonia, children have ear infections.  Patient with low grade temp.  Family tested negative for Covid.    Review of Systems Fever +  Cough+     Objective:   Physical Exam Ears without erythema or injection. Lungs are clear bilaterally however patient noted to have an occasional dry sounding cough. Heart regular rate and rhythm without murmur.    Assessment:     Viral URI with cough    Plan:     Rapid COVID and influenza test were negative in the clinic today.  Patient is encouraged to increase fluids, continue with ibuprofen or Tylenol as needed.  Patient reassured.  At this time he will continue with his counter medications he states that he slept well last night.

## 2021-01-09 NOTE — Progress Notes (Signed)
Fever and cough. Family tested negatoive 2 days ago. Rapid  Covid Test at 315pm   [employer] Covid-19 Antigen Rapid Test Lot #:  [id number] Serial #:  [id number] Exp:  02/04/21  Covid-19 Rapid Test Results = Negative  S/Sx started today - started moving slow. By 10:30 starting feeling feverish & checked it & it was 100.5. Feels achy in joints & muscles Chest congestion - productive cough this morning (greenish/brown mucus) Headache but thinks it was from coughing No discomfort to ears No sore throat  Wife has pnuemonia, but doesn't have flu or covid  Both children have a cough & ear infections  Takes generic Zyrtec  AMD

## 2021-01-12 ENCOUNTER — Telehealth: Payer: Self-pay

## 2021-01-12 ENCOUNTER — Ambulatory Visit
Admission: RE | Admit: 2021-01-12 | Discharge: 2021-01-12 | Disposition: A | Discharge status: Home / Self Care | Payer: 59 | Attending: Physician Assistant | Admitting: Physician Assistant

## 2021-01-12 ENCOUNTER — Other Ambulatory Visit: Payer: Self-pay

## 2021-01-12 ENCOUNTER — Other Ambulatory Visit: Payer: Self-pay | Admitting: Physician Assistant

## 2021-01-12 ENCOUNTER — Ambulatory Visit
Admission: RE | Admit: 2021-01-12 | Discharge: 2021-01-12 | Disposition: A | Discharge status: Home / Self Care | Payer: 59 | Source: Ambulatory Visit | Attending: Physician Assistant | Admitting: Physician Assistant

## 2021-01-12 DIAGNOSIS — U071 COVID-19: Secondary | ICD-10-CM

## 2021-01-12 MED ORDER — METHYLPREDNISOLONE 4 MG PO TBPK: ORAL_TABLET | ORAL | 0 refills | Status: DC

## 2021-01-12 MED ORDER — HYDROCOD POLST-CPM POLST ER 10-8 MG/5ML PO SUER: 5.0000 mL | Freq: Two times a day (BID) | ORAL | 0 refills | Status: DC

## 2021-01-12 MED ORDER — AZITHROMYCIN 250 MG PO TABS: ORAL_TABLET | ORAL | 0 refills | Status: DC

## 2021-01-12 NOTE — Telephone Encounter (Signed)
Ernest Alvarez called to report chronic coughing greenish / brown quarter size secretions throughout days/ night  with ongoing fever up to 100.5 x 5 days. He reports sleeplessness due to coughing. He is requesting antibiotic and any medication that could relieve cough be sent to [facility].

## 2021-01-12 NOTE — Telephone Encounter (Signed)
Follow up call to [facility] to ensure he was aware of prescribed medications and rest. Cassandra verbalized good understanding and is requesting an out of work note per provider discussion earlier today. Work note to be sent to his boss per [employer] request

## 2021-01-12 NOTE — Telephone Encounter (Signed)
[name] notified of CXRAY ordered at [facility]. He verbalized good understanding.

## 2021-01-16 ENCOUNTER — Ambulatory Visit: Payer: Self-pay | Admitting: Physician Assistant

## 2021-01-16 ENCOUNTER — Encounter: Payer: Self-pay | Admitting: Physician Assistant

## 2021-01-16 VITALS — BP 130/82 | HR 80 | Temp 97.8°F | Resp 14 | Ht 70.0 in | Wt 254.0 lb

## 2021-01-16 DIAGNOSIS — J189 Pneumonia, unspecified organism: Secondary | ICD-10-CM

## 2021-01-16 NOTE — Progress Notes (Signed)
Pt states he's doing a lot better. CL,RMA

## 2021-01-16 NOTE — Progress Notes (Signed)
   Subjective: Pneumonia    Patient ID: Ernest Alvarez, male    DOB: September 19, 1990, 30 y.o.   MRN: [id number]  HPI Patient follow-up status post diagnosis of pneumonia 01/09/2021.  Patient states feeling better.   Review of Systems     Negative Objective:   Physical Exam No acute distress.  Temperature 97.8 respiration 14, pulse 80, BP 130/82. HEENT is unremarkable.  Neck is supple active [place] or bruits.  Lungs are clear to auscultation.  Heart regular rate and rhythm.       Assessment & Plan: Resolving pneumonia  Patient return back to full duty.

## 2021-01-22 ENCOUNTER — Other Ambulatory Visit: Payer: Self-pay | Admitting: Physician Assistant

## 2021-01-22 DIAGNOSIS — U071 COVID-19: Secondary | ICD-10-CM

## 2021-02-06 ENCOUNTER — Other Ambulatory Visit: Payer: Self-pay

## 2021-02-06 DIAGNOSIS — F909 Attention-deficit hyperactivity disorder, unspecified type: Secondary | ICD-10-CM

## 2021-02-06 MED ORDER — AMPHETAMINE-DEXTROAMPHETAMINE 30 MG PO TABS: 1.0000 | ORAL_TABLET | Freq: Every day | ORAL | 0 refills | Status: DC

## 2021-02-27 ENCOUNTER — Other Ambulatory Visit: Payer: Self-pay

## 2021-02-27 DIAGNOSIS — F909 Attention-deficit hyperactivity disorder, unspecified type: Secondary | ICD-10-CM

## 2021-02-28 MED ORDER — AMPHETAMINE-DEXTROAMPHETAMINE 30 MG PO TABS: 1.0000 | ORAL_TABLET | Freq: Every day | ORAL | 0 refills | Status: DC

## 2021-03-05 ENCOUNTER — Other Ambulatory Visit: Payer: Self-pay

## 2021-03-05 DIAGNOSIS — F909 Attention-deficit hyperactivity disorder, unspecified type: Secondary | ICD-10-CM

## 2021-03-05 MED ORDER — AMPHETAMINE-DEXTROAMPHETAMINE 30 MG PO TABS: 1.0000 | ORAL_TABLET | Freq: Every day | ORAL | 0 refills | Status: DC

## 2021-03-05 NOTE — Telephone Encounter (Signed)
Resent  As previous submission did not send.

## 2021-03-24 ENCOUNTER — Ambulatory Visit: Payer: 59 | Admitting: Physician Assistant

## 2021-04-07 ENCOUNTER — Ambulatory Visit: Payer: Self-pay | Admitting: Physician Assistant

## 2021-04-07 ENCOUNTER — Other Ambulatory Visit: Payer: Self-pay

## 2021-04-07 ENCOUNTER — Encounter: Payer: Self-pay | Admitting: Physician Assistant

## 2021-04-07 DIAGNOSIS — F909 Attention-deficit hyperactivity disorder, unspecified type: Secondary | ICD-10-CM

## 2021-04-07 MED ORDER — AMPHETAMINE-DEXTROAMPHETAMINE 30 MG PO TABS: 1.0000 | ORAL_TABLET | Freq: Every day | ORAL | 0 refills | Status: DC

## 2021-04-07 NOTE — Progress Notes (Signed)
   Subjective: ADHD    Patient ID: Ernest Alvarez, male    DOB: December 28, 1990, 30 y.o.   MRN: [id number]  HPI Patient presents for medication refill of Adderall for ADHD.  Patient has taken this medicine greater than 10 years.   Review of Systems ADHD, erectile dysfunction,hypertension, and obesity.    Objective:   Physical Exam No acute distress.  Temperature 97.6, respiration 14.  Patient weighs 145 pounds and BMI 35.2.       Assessment & Plan: ADHD.   Patient is here for medication refill for ADHD.  Patient takes 30 mg of Adderall.  Prescription refilled and patient advised on follow-up requirements.

## 2021-04-07 NOTE — Progress Notes (Signed)
Pt needing adderalll refill.

## 2021-05-04 ENCOUNTER — Other Ambulatory Visit: Payer: Self-pay

## 2021-05-04 DIAGNOSIS — F909 Attention-deficit hyperactivity disorder, unspecified type: Secondary | ICD-10-CM

## 2021-05-04 MED ORDER — AMPHETAMINE-DEXTROAMPHETAMINE 30 MG PO TABS: 1.0000 | ORAL_TABLET | Freq: Every day | ORAL | 0 refills | Status: DC

## 2021-05-09 ENCOUNTER — Other Ambulatory Visit: Payer: Self-pay | Admitting: Physician Assistant

## 2021-05-09 DIAGNOSIS — I10 Essential (primary) hypertension: Secondary | ICD-10-CM

## 2021-06-02 ENCOUNTER — Other Ambulatory Visit: Payer: Self-pay

## 2021-06-02 DIAGNOSIS — F909 Attention-deficit hyperactivity disorder, unspecified type: Secondary | ICD-10-CM

## 2021-06-03 MED ORDER — AMPHETAMINE-DEXTROAMPHETAMINE 30 MG PO TABS: 1.0000 | ORAL_TABLET | Freq: Every day | ORAL | 0 refills | Status: DC

## 2021-06-17 ENCOUNTER — Other Ambulatory Visit: Payer: Self-pay

## 2021-06-17 DIAGNOSIS — Z0283 Encounter for blood-alcohol and blood-drug test: Secondary | ICD-10-CM

## 2021-06-17 NOTE — Progress Notes (Signed)
Presents to [facility] for Random DOT Drug Screen.  [facility] Acct #:  [id number] [facility] Specimen #:  [id number]  AMD

## 2021-06-19 ENCOUNTER — Other Ambulatory Visit: Payer: Self-pay | Admitting: Physician Assistant

## 2021-06-19 DIAGNOSIS — N529 Male erectile dysfunction, unspecified: Secondary | ICD-10-CM

## 2021-06-29 ENCOUNTER — Other Ambulatory Visit: Payer: Self-pay

## 2021-06-29 DIAGNOSIS — F909 Attention-deficit hyperactivity disorder, unspecified type: Secondary | ICD-10-CM

## 2021-06-29 MED ORDER — AMPHETAMINE-DEXTROAMPHETAMINE 30 MG PO TABS: 1.0000 | ORAL_TABLET | Freq: Every day | ORAL | 0 refills | Status: DC

## 2021-07-01 ENCOUNTER — Other Ambulatory Visit: Payer: Self-pay

## 2021-07-01 DIAGNOSIS — F909 Attention-deficit hyperactivity disorder, unspecified type: Secondary | ICD-10-CM

## 2021-07-02 MED ORDER — AMPHETAMINE-DEXTROAMPHETAMINE 30 MG PO TABS: 1.0000 | ORAL_TABLET | Freq: Every day | ORAL | 0 refills | Status: DC

## 2021-07-27 ENCOUNTER — Other Ambulatory Visit: Payer: Self-pay | Admitting: Physician Assistant

## 2021-07-27 DIAGNOSIS — F909 Attention-deficit hyperactivity disorder, unspecified type: Secondary | ICD-10-CM

## 2021-07-27 MED ORDER — AMPHETAMINE-DEXTROAMPHETAMINE 30 MG PO TABS: 1.0000 | ORAL_TABLET | Freq: Every day | ORAL | 0 refills | Status: DC

## 2021-08-25 ENCOUNTER — Other Ambulatory Visit: Payer: Self-pay

## 2021-08-25 DIAGNOSIS — F909 Attention-deficit hyperactivity disorder, unspecified type: Secondary | ICD-10-CM

## 2021-08-26 MED ORDER — AMPHETAMINE-DEXTROAMPHETAMINE 30 MG PO TABS: 1.0000 | ORAL_TABLET | Freq: Every day | ORAL | 0 refills | Status: DC

## 2021-08-27 ENCOUNTER — Other Ambulatory Visit: Payer: Self-pay

## 2021-08-27 NOTE — Telephone Encounter (Signed)
Called [facility] requesting Rx refill for Lisinopril.  When inputting Rx refill info into  Epic, a pended order opened up where patient's pharmacy sent an electronic refill request to [facility].  Re-routed the pended Rx refill request for Lisinopril 10 mg to Durward Parcel, PA-C.  AMD

## 2021-09-22 ENCOUNTER — Ambulatory Visit: Payer: Self-pay

## 2021-09-22 ENCOUNTER — Other Ambulatory Visit: Payer: Self-pay

## 2021-09-22 DIAGNOSIS — Z0000 Encounter for general adult medical examination without abnormal findings: Secondary | ICD-10-CM

## 2021-09-22 LAB — POCT URINALYSIS DIPSTICK
Bilirubin, UA: NEGATIVE
Blood, UA: NEGATIVE
Glucose, UA: NEGATIVE
Ketones, UA: NEGATIVE
Leukocytes, UA: NEGATIVE
Nitrite, UA: NEGATIVE
Protein, UA: NEGATIVE
Spec Grav, UA: 1.025 (ref 1.010–1.025)
Urobilinogen, UA: 0.2 E.U./dL
pH, UA: 5 (ref 5.0–8.0)

## 2021-09-22 NOTE — Progress Notes (Signed)
09/24/20 annual physical scheduled.

## 2021-09-23 LAB — CMP12+LP+TP+TSH+6AC+CBC/D/PLT
ALT: 33 IU/L (ref 0–44)
AST: 25 IU/L (ref 0–40)
Albumin/Globulin Ratio: 2 (ref 1.2–2.2)
Albumin: 4.9 g/dL (ref 4.0–5.0)
Alkaline Phosphatase: 71 IU/L (ref 44–121)
BUN/Creatinine Ratio: 12 (ref 9–20)
BUN: 13 mg/dL (ref 6–20)
Basophils Absolute: 0 10*3/uL (ref 0.0–0.2)
Basos: 1 %
Bilirubin Total: 0.4 mg/dL (ref 0.0–1.2)
Calcium: 9.9 mg/dL (ref 8.7–10.2)
Chloride: 99 mmol/L (ref 96–106)
Chol/HDL Ratio: 3.5 ratio (ref 0.0–5.0)
Cholesterol, Total: 180 mg/dL (ref 100–199)
Creatinine, Ser: 1.11 mg/dL (ref 0.76–1.27)
EOS (ABSOLUTE): 0 10*3/uL (ref 0.0–0.4)
Eos: 1 %
Estimated CHD Risk: 0.6 times avg. (ref 0.0–1.0)
Free Thyroxine Index: 2.8 (ref 1.2–4.9)
GGT: 35 IU/L (ref 0–65)
Globulin, Total: 2.4 g/dL (ref 1.5–4.5)
Glucose: 81 mg/dL (ref 70–99)
HDL: 51 mg/dL (ref >39)
Hematocrit: 46.1 % (ref 37.5–51.0)
Hemoglobin: 15.8 g/dL (ref 13.0–17.7)
Immature Grans (Abs): 0 10*3/uL (ref 0.0–0.1)
Immature Granulocytes: 1 %
Iron: 95 ug/dL (ref 38–169)
LDH: 231 IU/L — ABNORMAL HIGH (ref 121–224)
LDL Chol Calc (NIH): 107 mg/dL — ABNORMAL HIGH (ref 0–99)
Lymphocytes Absolute: 2.3 10*3/uL (ref 0.7–3.1)
Lymphs: 36 %
MCH: 30.3 pg (ref 26.6–33.0)
MCHC: 34.3 g/dL (ref 31.5–35.7)
MCV: 89 fL (ref 79–97)
Monocytes Absolute: 0.6 10*3/uL (ref 0.1–0.9)
Monocytes: 10 %
Neutrophils Absolute: 3.4 10*3/uL (ref 1.4–7.0)
Neutrophils: 51 %
Phosphorus: 4.1 mg/dL (ref 2.8–4.1)
Platelets: 274 10*3/uL (ref 150–450)
Potassium: 4.8 mmol/L (ref 3.5–5.2)
RBC: 5.21 x10E6/uL (ref 4.14–5.80)
RDW: 13 % (ref 11.6–15.4)
Sodium: 138 mmol/L (ref 134–144)
T3 Uptake Ratio: 31 % (ref 24–39)
T4, Total: 8.9 ug/dL (ref 4.5–12.0)
TSH: 1.83 u[IU]/mL (ref 0.450–4.500)
Total Protein: 7.3 g/dL (ref 6.0–8.5)
Triglycerides: 123 mg/dL (ref 0–149)
Uric Acid: 7.8 mg/dL (ref 3.8–8.4)
VLDL Cholesterol Cal: 22 mg/dL (ref 5–40)
WBC: 6.4 10*3/uL (ref 3.4–10.8)
eGFR: 90 mL/min/{1.73_m2} (ref >59)

## 2021-09-24 ENCOUNTER — Other Ambulatory Visit: Payer: Self-pay

## 2021-09-24 ENCOUNTER — Ambulatory Visit: Payer: Self-pay | Admitting: Physician Assistant

## 2021-09-24 ENCOUNTER — Encounter: Payer: Self-pay | Admitting: Physician Assistant

## 2021-09-24 VITALS — BP 142/88 | HR 88 | Temp 97.3°F | Resp 12 | Ht 70.0 in | Wt 261.0 lb

## 2021-09-24 DIAGNOSIS — Z0000 Encounter for general adult medical examination without abnormal findings: Secondary | ICD-10-CM

## 2021-09-24 DIAGNOSIS — F909 Attention-deficit hyperactivity disorder, unspecified type: Secondary | ICD-10-CM

## 2021-09-24 MED ORDER — AMPHETAMINE-DEXTROAMPHETAMINE 30 MG PO TABS: 1.0000 | ORAL_TABLET | Freq: Every day | ORAL | 0 refills | Status: DC

## 2021-09-24 NOTE — Progress Notes (Signed)
[place]  ____________________________________________   None    (approximate)  I have reviewed the triage vital signs and the nursing notes.   HISTORY  Chief Complaint Annual Exam    HPI Ernest Alvarez is a 31 y.o. male patient presents for annual physical exam.  Patient voices no concerns or complaints.         Past Medical History:  Diagnosis Date   ADD (attention deficit disorder)    ED (erectile dysfunction)    Elevated LFTs     Patient Active Problem List   Diagnosis Date Noted   Essential hypertension 04/19/2019   Class 2 severe obesity due to excess calories with serious comorbidity and body mass index (BMI) of 35.0 to 35.9 in adult [facility]) 04/19/2019   ADD (attention deficit disorder) 02/06/2016    History reviewed. No pertinent surgical history.  Prior to Admission medications   Medication Sig Start Date End Date Taking? Authorizing Provider  amphetamine-dextroamphetamine (ADDERALL) 30 MG tablet Take 1 tablet by mouth daily. 08/26/21  Yes Sable Feil, PA-C  lisinopril (ZESTRIL) 10 MG tablet Take 1 tablet (10 mg total) by mouth daily. 08/27/21  Yes Sable Feil, PA-C  sildenafil (REVATIO) 20 MG tablet Take 1 tablet (20 mg total) by mouth as directed. 02/15/20  Yes Sable Feil, PA-C  cetirizine (ZYRTEC) 10 MG tablet Take 10 mg by mouth daily. Patient not taking: Reported on 09/24/2021    [provider]  predniSONE (DELTASONE) 10 MG tablet 3 tabs daily for 3 days, 2 tab daily for 3 days, 1 tab for 3 day 08/10/21   [provider]    Allergies Patient has no known allergies.  Family History  Problem Relation Age of Onset   Diabetes Maternal Grandmother     Social History Social History   Tobacco Use   Smoking status: Never   Smokeless tobacco: Never  Substance Use Topics   Alcohol use: Yes    Alcohol/week: 10.0 standard drinks    Types: 10 Cans of beer per week    Review of Systems Constitutional: No  fever/chills Eyes: No visual changes. ENT: No sore throat. Cardiovascular: Denies chest pain. Respiratory: Denies shortness of breath. Gastrointestinal: No abdominal pain.  No nausea, no vomiting.  No diarrhea.  No constipation. Genitourinary: Negative for dysuria.  Erectile dysfunction Musculoskeletal: Negative for back pain. Skin: Negative for rash. Neurological: Negative for headaches, focal weakness or numbness. Psychiatric: ADD Endocrine: Hypertension  ____________________________________________   PHYSICAL EXAM: VITAL SIGNS: Temperature is 97.3, pulse 88, respiration 12, BP is 142/88, and patient is 98% O2 sat on room air.  Patient weighs 261 pounds and BMI is 37.45. Eyes: Conjunctivae are normal. PERRL. EOMI. Head: Atraumatic. Nose: No congestion/rhinnorhea. Mouth/Throat: Mucous membranes are moist.  Oropharynx non-erythematous. Neck: No stridor.  No cervical spine tenderness to palpation. Hematological/Lymphatic/Immunilogical: No cervical lymphadenopathy. Cardiovascular: Normal rate, regular rhythm. Grossly normal heart sounds.  Good peripheral circulation. Respiratory: Normal respiratory effort.  No retractions. Lungs CTAB. Gastrointestinal: Soft and nontender.  Mild distention secondary to body habitus. No abdominal bruits. No CVA tenderness. Genitourinary: Deferred Musculoskeletal: No lower extremity tenderness nor edema.  No joint effusions. Neurologic:  Normal speech and language. No gross focal neurologic deficits are appreciated. No gait instability. Skin:  Skin is warm, dry and intact. No rash noted. Psychiatric: Mood and affect are normal. Speech and behavior are normal.  ____________________________________________   LABS  _______       Component Ref Range & Units 2  d ago 1 yr ago 2 yr ago  Color, UA  pale yellow  yellow  Yellow   Clarity, UA  clear  clear  Clear   Glucose, UA Negative Negative  Negative  Negative   Bilirubin, UA  negative  negativ   Negative   Ketones, UA  negative  negative  Negative   Spec Grav, UA 1.010 - 1.025 1.025  1.025  >=1.030 Abnormal    Blood, UA  negative  negative  Negative   pH, UA 5.0 - 8.0 5.0  5.5  5.5   Protein, UA Negative Negative  Negative  Negative   Urobilinogen, UA 0.2 or 1.0 E.U./dL 0.2  0.2  0.2   Nitrite, UA  negative  negative  Negative   Leukocytes, UA Negative Negative  Negative  Negative   Appearance  medium  dark     Odor               Specimen Collected: 09/22/21 10:12 Last Resulted: 09/22/21 10:12      Lab Flowsheet    Order Details    View Encounter    Lab and Collection Details    Routing    Result History    View Encounter Conversation        Result Care Coordination   Patient Communication   Add Comments   Seen Back to Top       Other Results from 09/22/2021   Contains abnormal data CMP12+LP+TP+TSH+6AC+CBC/D/Plt Order: [id number] Status: Final result    Visible to patient: Yes (seen)    Next appt: None    Dx: Routine adult health maintenance    0 Result Notes       Component Ref Range & Units 2 d ago 1 yr ago 2 yr ago  Glucose 70 - 99 mg/dL 81  96 R  88 R   Uric Acid 3.8 - 8.4 mg/dL 7.8  6.2 CM  6.1 R, CM   Comment:            Therapeutic target for gout patients: <6.0  BUN 6 - 20 mg/dL '13  14  14   ' Creatinine, Ser 0.76 - 1.27 mg/dL 1.11  1.07  1.08   eGFR >59 mL/min/1.73 90     BUN/Creatinine Ratio 9 - '20 12  13  13   ' Sodium 134 - 144 mmol/L 138  140  137   Potassium 3.5 - 5.2 mmol/L 4.8  4.3  5.0   Chloride 96 - 106 mmol/L 99  107 High   99   Calcium 8.7 - 10.2 mg/dL 9.9  9.5  9.9   Phosphorus 2.8 - 4.1 mg/dL 4.1  3.6  3.9   Total Protein 6.0 - 8.5 g/dL 7.3  6.7  7.0   Albumin 4.0 - 5.0 g/dL 4.9  4.4  4.7 R   Globulin, Total 1.5 - 4.5 g/dL 2.4  2.3  2.3   Albumin/Globulin Ratio 1.2 - 2.2 2.0  1.9  2.0   Bilirubin Total 0.0 - 1.2 mg/dL 0.4  0.4  0.4   Alkaline Phosphatase 44 - 121 IU/L 71  62 CM  61 R   LDH 121 - 224 IU/L 231 High   204  204    AST 0 - 40 IU/L 25  29  32   ALT 0 - 44 IU/L 33  45 High   54 High    GGT 0 - 65 IU/L 35  30  37   Iron 38 - 169  ug/dL 95  83  136   Cholesterol, Total 100 - 199 mg/dL 180  158  161   Triglycerides 0 - 149 mg/dL 123  127  79   HDL >39 mg/dL 51  43  47   VLDL Cholesterol Cal 5 - 40 mg/dL '22  23  15   ' LDL Chol Calc (NIH) 0 - 99 mg/dL 107 High   92  99   Chol/HDL Ratio 0.0 - 5.0 ratio 3.5  3.7 CM  3.4 CM   Comment:                                   T. Chol/HDL Ratio                                              Men  Women                                1/2 Avg.Risk  3.4    3.3                                    Avg.Risk  5.0    4.4                                 2X Avg.Risk  9.6    7.1                                 3X Avg.Risk 23.4   11.0   Estimated CHD Risk 0.0 - 1.0 times avg. 0.6  0.6 CM  0.5 CM   Comment: The CHD Risk is based on the T. Chol/HDL ratio. Other  factors affect CHD Risk such as hypertension, smoking,  diabetes, severe obesity, and family history of  premature CHD.   TSH 0.450 - 4.500 uIU/mL 1.830  1.510  1.930   T4, Total 4.5 - 12.0 ug/dL 8.9  6.5  7.5   T3 Uptake Ratio 24 - 39 % '31  28  30   ' Free Thyroxine Index 1.2 - 4.9 2.8  1.8  2.3   WBC 3.4 - 10.8 x10E3/uL 6.4  5.2  5.3   RBC 4.14 - 5.80 x10E6/uL 5.21  5.18  5.34   Hemoglobin 13.0 - 17.7 g/dL 15.8  15.8  16.3   Hematocrit 37.5 - 51.0 % 46.1  45.8  47.5   MCV 79 - 97 fL 89  88  89   MCH 26.6 - 33.0 pg 30.3  30.5  30.5   MCHC 31.5 - 35.7 g/dL 34.3  34.5  34.3   RDW 11.6 - 15.4 % 13.0  12.1  12.7   Platelets 150 - 450 x10E3/uL 274  242  252   Neutrophils Not Estab. % 51  60  53   Lymphs Not Estab. % 36  30  35   Monocytes Not Estab. % '10  7  10   ' Eos Not Estab. % '1  2  1   ' Basos Not Estab. % 1  1  1  Neutrophils Absolute 1.4 - 7.0 x10E3/uL 3.4  3.1  2.8   Lymphocytes Absolute 0.7 - 3.1 x10E3/uL 2.3  1.6  1.9   Monocytes Absolute 0.1 - 0.9 x10E3/uL 0.6  0.4  0.5   EOS (ABSOLUTE) 0.0 - 0.4 x10E3/uL 0.0   0.1  0.0   Basophils Absolute 0.0 - 0.2 x10E3/uL 0.0  0.0  0.0   Immature Granulocytes Not Estab. % 1  0  0           ______________________________     ____________________________________________  ____________________________________________      ____________________________________________   INITIAL IMPRESSION / ASSESSMENT AND PLAN   As part of my medical decision making, I reviewed the following data within the [place]  Well exam.  Discussed lab results with patient.  Refill for Adderall will be generated today.              ____________________________________________   FINAL CLINICAL IMPRESSION  Well exam.   ED Discharge Orders     None        Note:  This document was prepared using Dragon voice recognition software and may include unintentional dictation errors.

## 2021-10-21 ENCOUNTER — Other Ambulatory Visit: Payer: Self-pay

## 2021-10-21 DIAGNOSIS — F909 Attention-deficit hyperactivity disorder, unspecified type: Secondary | ICD-10-CM

## 2021-10-21 MED ORDER — AMPHETAMINE-DEXTROAMPHETAMINE 30 MG PO TABS: 1.0000 | ORAL_TABLET | Freq: Every day | ORAL | 0 refills | Status: DC

## 2021-11-16 ENCOUNTER — Other Ambulatory Visit: Payer: Self-pay

## 2021-11-16 DIAGNOSIS — F909 Attention-deficit hyperactivity disorder, unspecified type: Secondary | ICD-10-CM

## 2021-11-16 MED ORDER — AMPHETAMINE-DEXTROAMPHETAMINE 30 MG PO TABS: 1.0000 | ORAL_TABLET | Freq: Every day | ORAL | 0 refills | Status: DC

## 2021-12-01 IMAGING — CR DG CHEST 2V
1 series · 2 of 2 positions shown · non-contrast
Comparison: None.

CLINICAL DATA: COVID, cough, fever

EXAM:
CHEST - 2 VIEW

[Series 1: dg chest 2 view · 0.14mm/px · 2 of 2 slices shown]
[im 1/2]
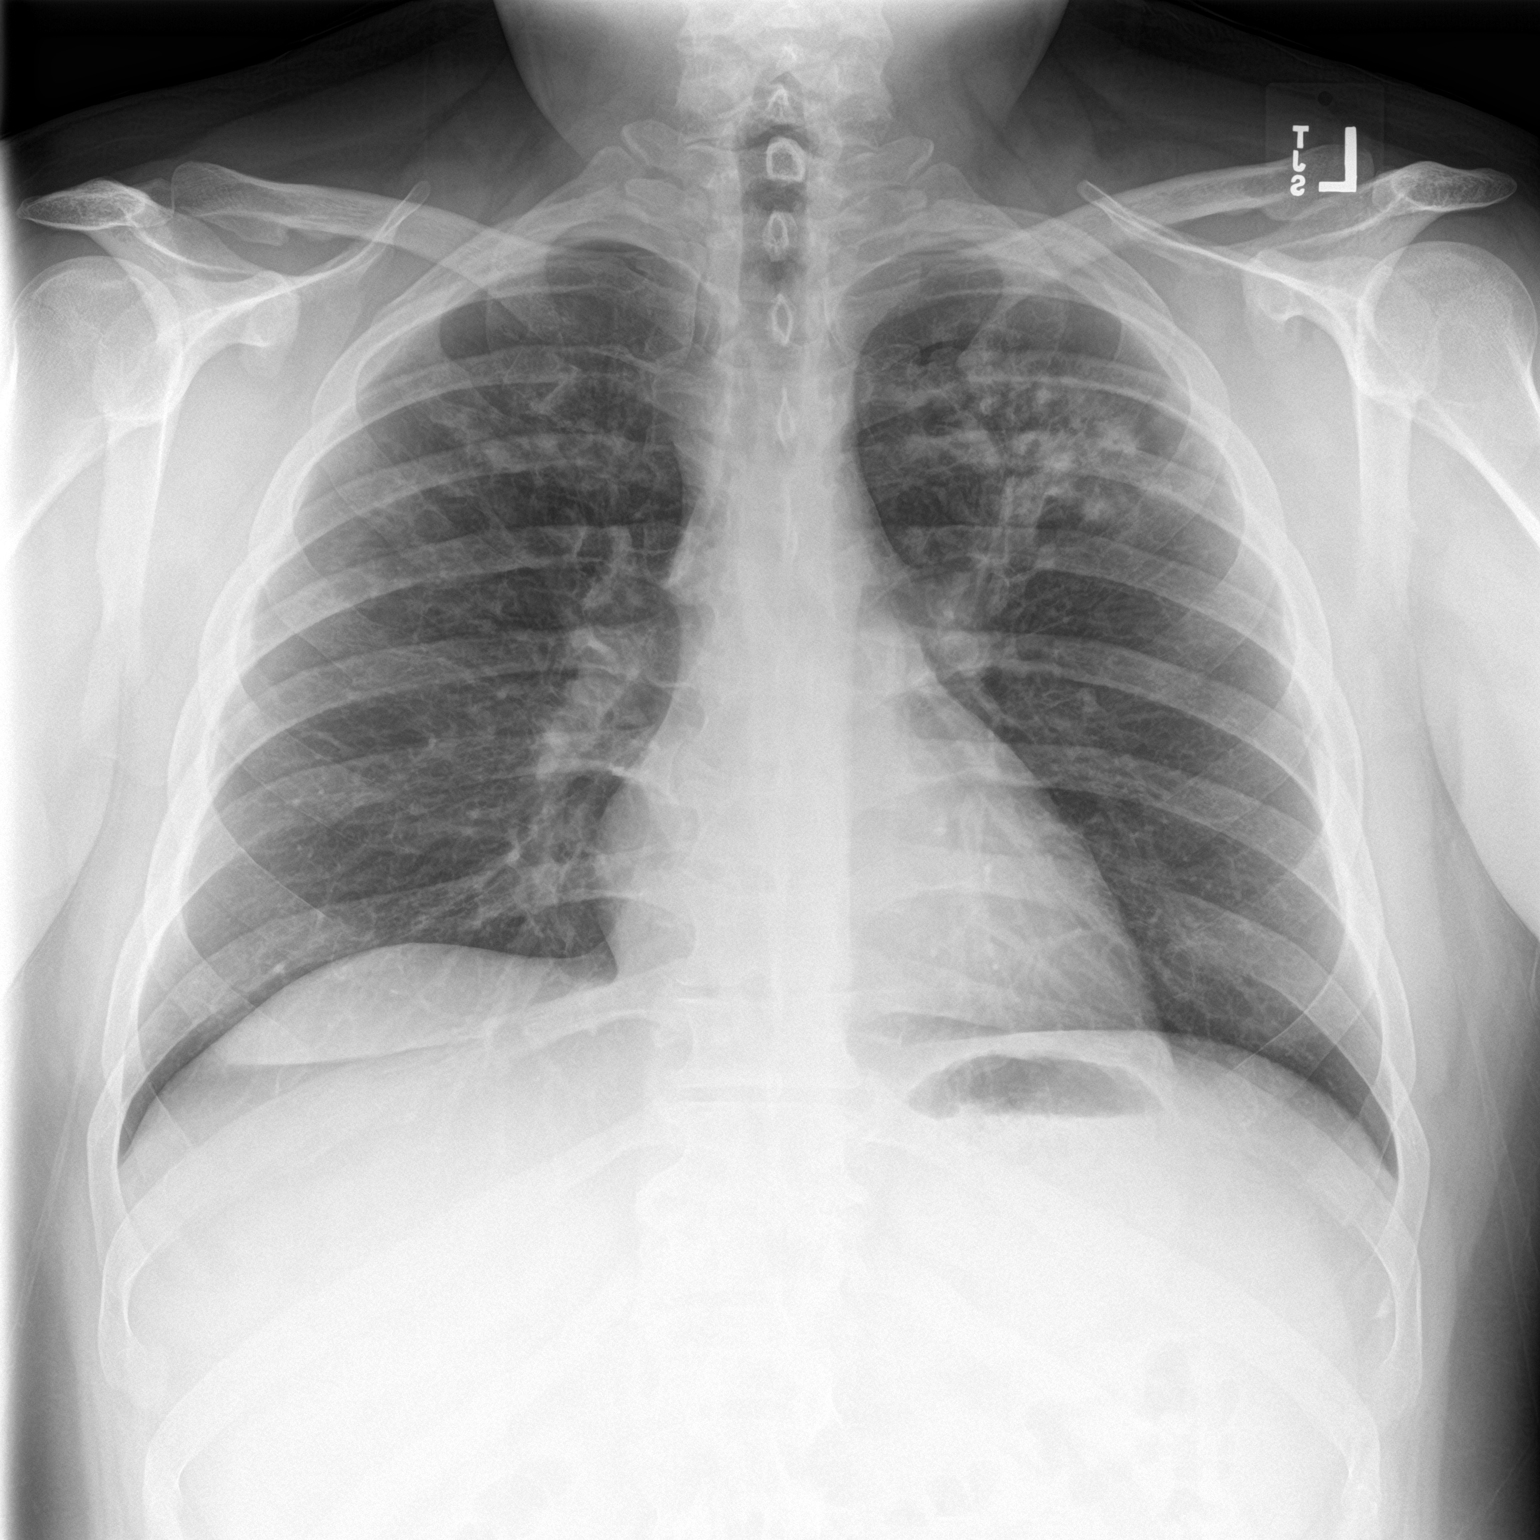
[im 2/2]
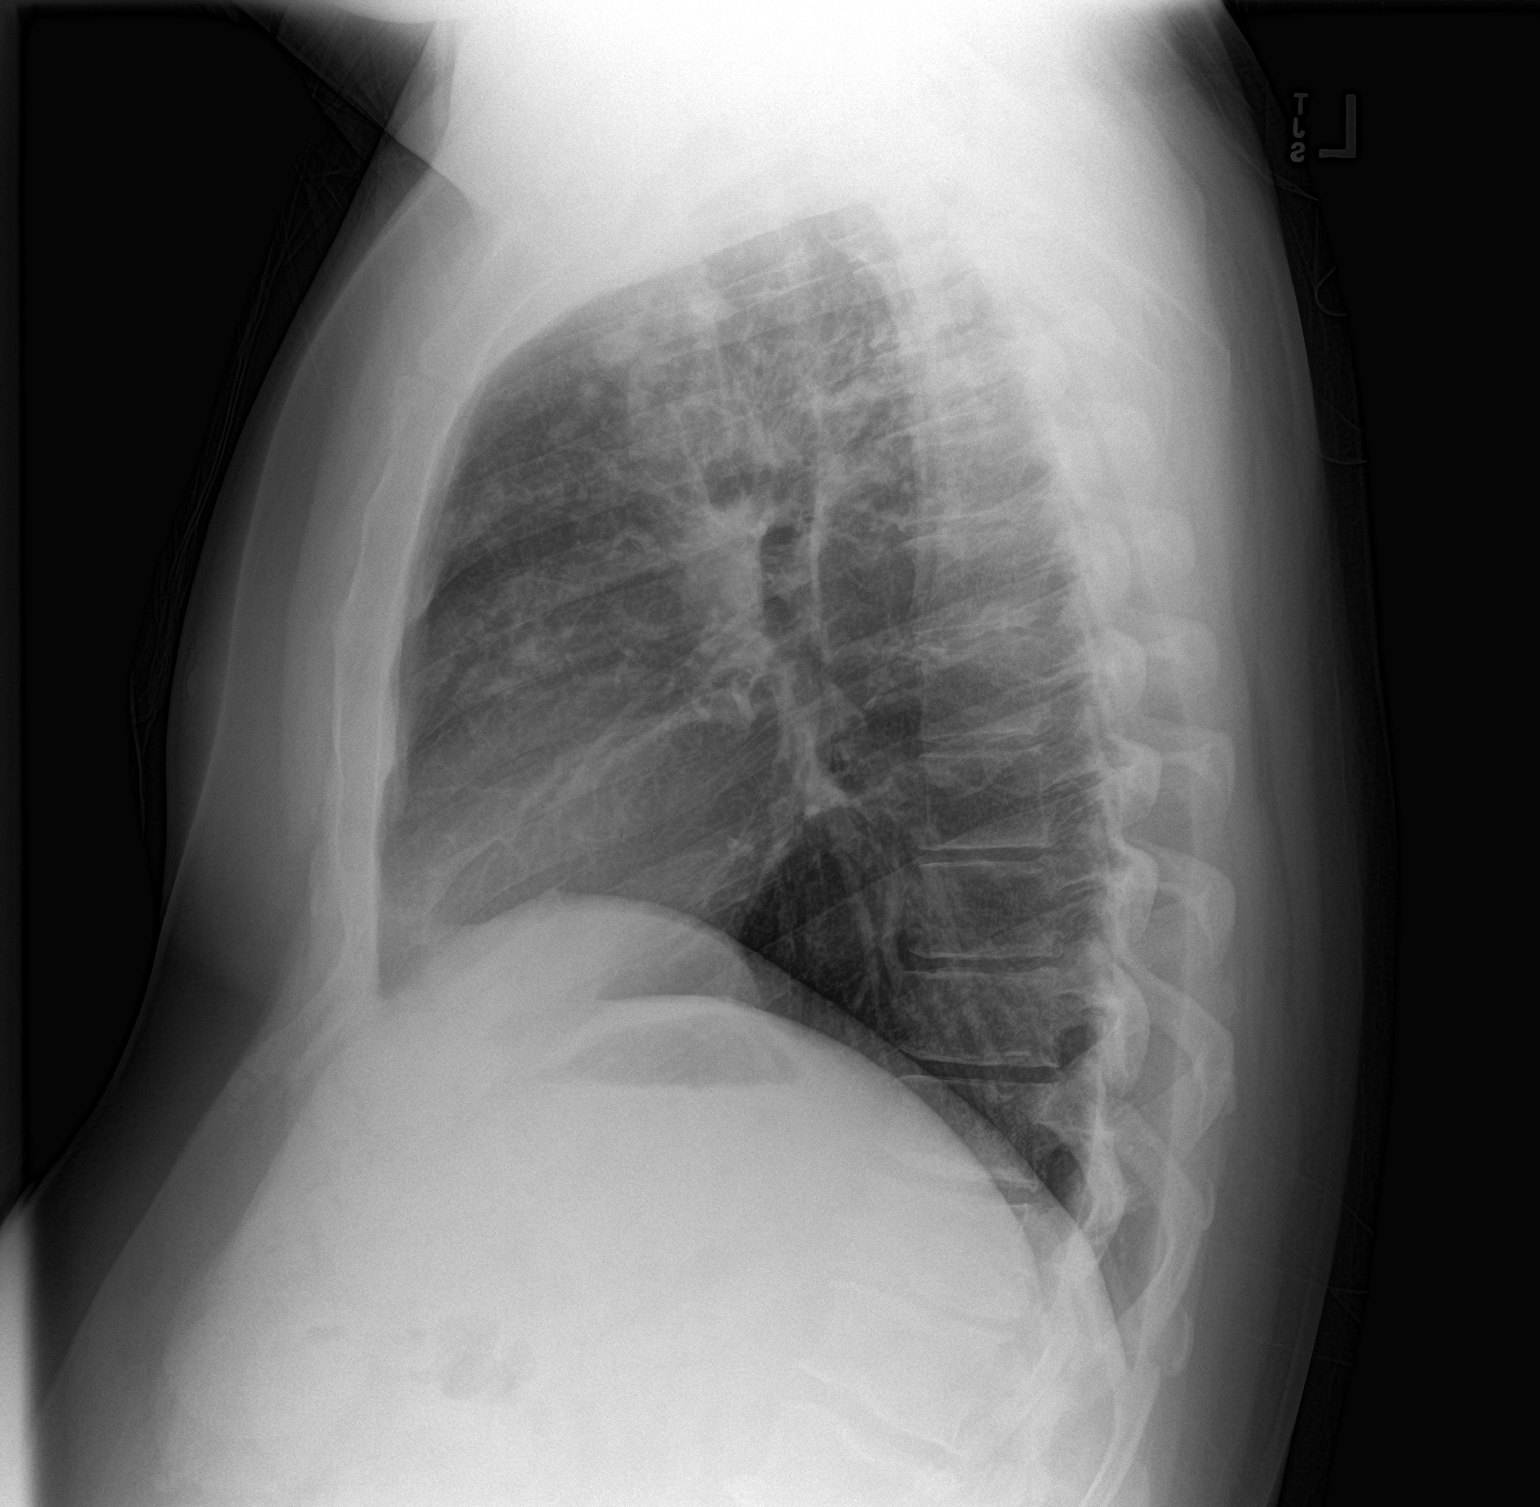

[2 of 2 positions shown; findings below may reference images not displayed]

FINDINGS: Bilateral upper lobe airspace opacities, left greater than right
compatible with pneumonia. Heart is normal size. No effusions or
pneumothorax. No acute bony abnormality.
IMPRESSION: Bilateral upper lobe airspace opacities, left greater than right
compatible with pneumonia.

## 2021-12-16 ENCOUNTER — Other Ambulatory Visit: Payer: Self-pay

## 2021-12-16 DIAGNOSIS — F909 Attention-deficit hyperactivity disorder, unspecified type: Secondary | ICD-10-CM

## 2021-12-16 MED ORDER — AMPHETAMINE-DEXTROAMPHETAMINE 30 MG PO TABS: 1.0000 | ORAL_TABLET | Freq: Every day | ORAL | 0 refills | Status: DC

## 2022-01-13 ENCOUNTER — Other Ambulatory Visit: Payer: Self-pay

## 2022-01-13 DIAGNOSIS — F909 Attention-deficit hyperactivity disorder, unspecified type: Secondary | ICD-10-CM

## 2022-01-13 MED ORDER — AMPHETAMINE-DEXTROAMPHETAMINE 30 MG PO TABS: 1.0000 | ORAL_TABLET | Freq: Every day | ORAL | 0 refills | Status: DC

## 2022-02-09 ENCOUNTER — Other Ambulatory Visit: Payer: Self-pay

## 2022-02-09 DIAGNOSIS — F909 Attention-deficit hyperactivity disorder, unspecified type: Secondary | ICD-10-CM

## 2022-02-09 MED ORDER — AMPHETAMINE-DEXTROAMPHETAMINE 30 MG PO TABS: 1.0000 | ORAL_TABLET | Freq: Every day | ORAL | 0 refills | Status: DC

## 2022-03-10 ENCOUNTER — Other Ambulatory Visit: Payer: Self-pay | Admitting: Physician Assistant

## 2022-03-10 DIAGNOSIS — F909 Attention-deficit hyperactivity disorder, unspecified type: Secondary | ICD-10-CM

## 2022-03-10 NOTE — Telephone Encounter (Signed)
Patient is eligible for renewals until 04/15/22  Adderall 30 mg  # 30 1 QD 90 from [facility] Renew one month now
